# Patient Record
Sex: Male | Born: 1995 | Race: White | Hispanic: No | Marital: Married | Smoking: Never smoker
Health system: Southern US, Community
[De-identification: ages and names within clinical notes are randomized; demographics above are authoritative.]

---

## 2020-10-02 ENCOUNTER — Other Ambulatory Visit: Payer: Self-pay

## 2020-10-02 ENCOUNTER — Emergency Department (HOSPITAL_BASED_OUTPATIENT_CLINIC_OR_DEPARTMENT_OTHER): Payer: Managed Care, Other (non HMO)

## 2020-10-02 ENCOUNTER — Encounter (HOSPITAL_BASED_OUTPATIENT_CLINIC_OR_DEPARTMENT_OTHER): Payer: Self-pay | Admitting: Emergency Medicine

## 2020-10-02 ENCOUNTER — Emergency Department (HOSPITAL_BASED_OUTPATIENT_CLINIC_OR_DEPARTMENT_OTHER)
Admission: EM | Admit: 2020-10-02 | Discharge: 2020-10-02 | Disposition: A | Discharge status: Home / Self Care | Payer: Managed Care, Other (non HMO) | Attending: Emergency Medicine | Admitting: Emergency Medicine

## 2020-10-02 DIAGNOSIS — R0789 Other chest pain: Secondary | ICD-10-CM

## 2020-10-02 MED ORDER — OXYCODONE HCL 5 MG PO TABS
5.0000 mg | ORAL_TABLET | Freq: Once | ORAL | Status: AC
  Administered 2020-10-02: 5 mg via ORAL
  Filled 2020-10-02: qty 1

## 2020-10-02 MED ORDER — KETOROLAC TROMETHAMINE 15 MG/ML IJ SOLN
15.0000 mg | Freq: Once | INTRAMUSCULAR | Status: DC
  Filled 2020-10-02: qty 1

## 2020-10-02 MED ORDER — DIAZEPAM 5 MG PO TABS
5.0000 mg | ORAL_TABLET | Freq: Once | ORAL | Status: AC
  Administered 2020-10-02: 5 mg via ORAL
  Filled 2020-10-02: qty 1

## 2020-10-02 MED ORDER — KETOROLAC TROMETHAMINE 15 MG/ML IJ SOLN
15.0000 mg | Freq: Once | INTRAMUSCULAR | Status: AC
  Administered 2020-10-02: 15 mg via INTRAMUSCULAR

## 2020-10-02 MED ORDER — ACETAMINOPHEN 500 MG PO TABS
1000.0000 mg | ORAL_TABLET | Freq: Once | ORAL | Status: AC
  Administered 2020-10-02: 1000 mg via ORAL
  Filled 2020-10-02: qty 2

## 2020-10-02 NOTE — ED Provider Notes (Signed)
[facility] EMERGENCY DEPARTMENT Provider Note   CSN: [id number] Arrival date & time: 10/02/20  1933     History Chief Complaint  Patient presents with   Chest Pain    Henry Mahoney is a 25 y.o. male.  25 yo M with a chief complaints of left-sided chest pain.  This been going on for some time.  Has been seeing a chiropractor and is seen an orthopedic doctor and then is decided to go see a different orthopedic doctor for a second opinion.  Is awaiting MRI to be evaluated for cervical radiculopathy.  He feels like his pain mostly is on the anterior aspect of the chest and radiates up into the left side of the anterior neck.  Worse with palpation on a certain portion of his chest wall.  Worse with movement twisting deep breathing.  The patient actually had seen their new provider today and was told if the pain worsens to come to the ED.  The history is provided by the patient.  Chest Pain Pain location:  L lateral chest Pain quality: aching and sharp   Pain radiates to:  Does not radiate Pain severity:  Moderate Onset quality:  Gradual Duration:  2 months Timing:  Intermittent Progression:  Waxing and waning Chronicity:  New Relieved by:  Nothing Worsened by:  Nothing Ineffective treatments:  None tried Associated symptoms: no abdominal pain, no fever, no headache, no palpitations, no shortness of breath and no vomiting       History reviewed. No pertinent past medical history.  There are no problems to display for this patient.   History reviewed. No pertinent surgical history.     History reviewed. No pertinent family history.     Home Medications Prior to Admission medications   Not on File    Allergies    Patient has no known allergies.  Review of Systems   Review of Systems  Constitutional:  Negative for chills and fever.  HENT:  Negative for congestion and facial swelling.   Eyes:  Negative for discharge and visual disturbance.   Respiratory:  Negative for shortness of breath.   Cardiovascular:  Positive for chest pain. Negative for palpitations.  Gastrointestinal:  Negative for abdominal pain, diarrhea and vomiting.  Musculoskeletal:  Positive for neck pain. Negative for arthralgias and myalgias.  Skin:  Negative for color change and rash.  Neurological:  Negative for tremors, syncope and headaches.  Psychiatric/Behavioral:  Negative for confusion and dysphoric mood.    Physical Exam Updated Vital Signs BP 134/88   Pulse (!) 57   Temp 98.1 F (36.7 C) (Oral)   Resp 16   Ht 5\' 8"  (1.727 m)   Wt 68 kg   SpO2 100%   BMI 22.81 kg/m   Physical Exam Vitals and nursing note reviewed.  Constitutional:      Appearance: He is well-developed.  HENT:     Head: Normocephalic and atraumatic.  Eyes:     Pupils: Pupils are equal, round, and reactive to light.  Neck:     Vascular: No JVD.  Cardiovascular:     Rate and Rhythm: Normal rate and regular rhythm.     Heart sounds: No murmur heard.   No friction rub. No gallop.  Pulmonary:     Effort: No respiratory distress.     Breath sounds: No wheezing.  Chest:     Chest wall: Tenderness present.     Comments: TTP about the lateral clavicular line about ribs 6-8 reproduces  pain Abdominal:     General: There is no distension.     Tenderness: There is no abdominal tenderness. There is no guarding or rebound.  Musculoskeletal:        General: Normal range of motion.     Cervical back: Normal range of motion and neck supple.     Comments: Full range of motion of the left shoulder without any significant tenderness.  Pulse motor and sensation intact distally.  Negative Spurling's test bilaterally.  Skin:    Coloration: Skin is not pale.     Findings: No rash.  Neurological:     Mental Status: He is alert and oriented to person, place, and time.  Psychiatric:        Behavior: Behavior normal.    ED Results / Procedures / Treatments   Labs (all labs ordered  are listed, but only abnormal results are displayed) Labs Reviewed - No data to display  EKG EKG Interpretation  Date/Time:  Monday October 02 2020 19:46:54 EDT Ventricular Rate:  65 PR Interval:  154 QRS Duration: 84 QT Interval:  366 QTC Calculation: 380 R Axis:   88 Text Interpretation: Normal sinus rhythm with sinus arrhythmia Normal ECG No old tracing to compare Confirmed by Melene Plan [phone]) on 10/02/2020 10:19:25 PM  Radiology DG Chest 2 View  Result Date: 10/02/2020 CLINICAL DATA:  LEFT CHEST PAIN EXAM: CHEST - 2 VIEW COMPARISON:  None. FINDINGS: No consolidation, features of edema, pneumothorax, or effusion. Pulmonary vascularity is normally distributed. The cardiomediastinal contours are unremarkable. No acute osseous or soft tissue abnormality. IMPRESSION: No acute cardiopulmonary abnormality. Electronically Signed   By: Kreg Shropshire M.D.   On: 10/02/2020 20:59    Procedures Procedures   Medications Ordered in ED Medications  acetaminophen (TYLENOL) tablet 1,000 mg (1,000 mg Oral Given 10/02/20 2247)  oxyCODONE (Oxy IR/ROXICODONE) immediate release tablet 5 mg (5 mg Oral Given 10/02/20 2247)  diazepam (VALIUM) tablet 5 mg (5 mg Oral Given 10/02/20 2247)  ketorolac (TORADOL) 15 MG/ML injection 15 mg (15 mg Intramuscular Given 10/02/20 2252)    ED Course  I have reviewed the triage vital signs and the nursing notes.  Pertinent labs & imaging results that were available during my care of the patient were reviewed by me and considered in my medical decision making (see chart for details).    MDM Rules/Calculators/A&P                          25 yo M with many months of left-sided pain.  Has been seeing a chiropractor and an orthopedic doctor and recently changed orthopedic doctors today.  Had a visit today they told him if he had worsening pain to come to the ED.  He told me that it got a little bit worse and so decided to come here.  He does not appear to be any significant  distress.  He has palpable tenderness to the left anterior chest.  Seems to reproduce his symptoms.  I will treat his pain here.  Have him follow-up with his orthopedic doctor.  I feel this is completely atypical of ACS or PE.  He had EKG is unremarkable chest x-ray viewed by me without focal infiltrate pneumothorax.  10:55 PM:  I have discussed the diagnosis/risks/treatment options with the patient and believe the pt to be eligible for discharge home to follow-up with PCP, ortho. We also discussed returning to the ED immediately if new or worsening  sx occur. We discussed the sx which are most concerning (e.g., sudden worsening pain, fever, inability to tolerate by mouth) that necessitate immediate return. Medications administered to the patient during their visit and any new prescriptions provided to the patient are listed below.  Medications given during this visit Medications  acetaminophen (TYLENOL) tablet 1,000 mg (1,000 mg Oral Given 10/02/20 2247)  oxyCODONE (Oxy IR/ROXICODONE) immediate release tablet 5 mg (5 mg Oral Given 10/02/20 2247)  diazepam (VALIUM) tablet 5 mg (5 mg Oral Given 10/02/20 2247)  ketorolac (TORADOL) 15 MG/ML injection 15 mg (15 mg Intramuscular Given 10/02/20 2252)     The patient appears reasonably screen and/or stabilized for discharge and I doubt any other medical condition or other [facility] requiring further screening, evaluation, or treatment in the ED at this time prior to discharge.   Final Clinical Impression(s) / ED Diagnoses Final diagnoses:  Chest wall pain    Rx / DC Orders ED Discharge Orders     None        Melene Plan, DO 10/02/20 2255

## 2020-10-02 NOTE — ED Notes (Signed)
Pt provided discharge instructions and prescription information. Pt was given the opportunity to ask questions and questions were answered. Discharge signature not obtained in the setting of the COVID-19 pandemic in order to reduce high touch surfaces.  ° °Pt teaching provided on medications that may cause drowsiness. Pt instructed not to drive or operate heavy machinery while taking the prescribed medication. Pt verbalized understanding.  ° °

## 2020-10-02 NOTE — ED Triage Notes (Signed)
Pt presents to ED POV. Pt c/o L CP. Pt reports that he was seen at ED sent to ortho for follow up. Is unable to be seen until July and told to come back here if CP worsens

## 2020-10-02 NOTE — Discharge Instructions (Addendum)
Take 4 over the counter ibuprofen tablets 3 times a day or 2 over-the-counter naproxen tablets twice a day for pain. Also take tylenol 1000mg(2 extra strength) four times a day.    

## 2020-11-05 ENCOUNTER — Encounter (HOSPITAL_BASED_OUTPATIENT_CLINIC_OR_DEPARTMENT_OTHER): Payer: Self-pay | Admitting: Emergency Medicine

## 2020-11-05 ENCOUNTER — Emergency Department (HOSPITAL_BASED_OUTPATIENT_CLINIC_OR_DEPARTMENT_OTHER)
Admission: EM | Admit: 2020-11-05 | Discharge: 2020-11-06 | Disposition: A | Discharge status: Home / Self Care | Payer: Managed Care, Other (non HMO) | Attending: Emergency Medicine | Admitting: Emergency Medicine

## 2020-11-05 ENCOUNTER — Other Ambulatory Visit: Payer: Self-pay

## 2020-11-05 ENCOUNTER — Emergency Department (HOSPITAL_BASED_OUTPATIENT_CLINIC_OR_DEPARTMENT_OTHER): Payer: Managed Care, Other (non HMO)

## 2020-11-05 DIAGNOSIS — R072 Precordial pain: Secondary | ICD-10-CM | POA: Diagnosis not present

## 2020-11-05 DIAGNOSIS — M25512 Pain in left shoulder: Secondary | ICD-10-CM | POA: Insufficient documentation

## 2020-11-05 DIAGNOSIS — M25511 Pain in right shoulder: Secondary | ICD-10-CM | POA: Diagnosis not present

## 2020-11-05 DIAGNOSIS — F606 Avoidant personality disorder: Secondary | ICD-10-CM | POA: Insufficient documentation

## 2020-11-05 LAB — BASIC METABOLIC PANEL
Anion gap: 9 (ref 5–15)
BUN: 18 mg/dL (ref 6–20)
CO2: 24 mmol/L (ref 22–32)
Calcium: 9.2 mg/dL (ref 8.9–10.3)
Chloride: 103 mmol/L (ref 98–111)
Creatinine, Ser: 0.98 mg/dL (ref 0.61–1.24)
GFR, Estimated: >60 mL/min (ref >60)
Glucose, Bld: 112 mg/dL — ABNORMAL HIGH (ref 70–99)
Potassium: 3.7 mmol/L (ref 3.5–5.1)
Sodium: 136 mmol/L (ref 135–145)

## 2020-11-05 LAB — CBC
HCT: 40 % (ref 39.0–52.0)
Hemoglobin: 13.9 g/dL (ref 13.0–17.0)
MCH: 31.9 pg (ref 26.0–34.0)
MCHC: 34.8 g/dL (ref 30.0–36.0)
MCV: 91.7 fL (ref 80.0–100.0)
Platelets: 289 10*3/uL (ref 150–400)
RBC: 4.36 MIL/uL (ref 4.22–5.81)
RDW: 12.1 % (ref 11.5–15.5)
WBC: 6.4 10*3/uL (ref 4.0–10.5)
nRBC: 0 % (ref 0.0–0.2)

## 2020-11-05 LAB — TROPONIN I (HIGH SENSITIVITY): Troponin I (High Sensitivity): <2 ng/L (ref <18)

## 2020-11-05 MED ORDER — ALUM & MAG HYDROXIDE-SIMETH 200-200-20 MG/5ML PO SUSP
30.0000 mL | Freq: Once | ORAL | Status: AC
  Administered 2020-11-05: 30 mL via ORAL
  Filled 2020-11-05: qty 30

## 2020-11-05 NOTE — ED Triage Notes (Signed)
Pt reports getting "steroid injections" last Monday in "chest, neck, shoulder"; reports all day "feels like something is sitting on my chest"; reports SHOB and epigastric pain

## 2020-11-05 NOTE — ED Notes (Signed)
EDP at bedside  

## 2020-11-06 ENCOUNTER — Emergency Department (HOSPITAL_BASED_OUTPATIENT_CLINIC_OR_DEPARTMENT_OTHER): Payer: Managed Care, Other (non HMO)

## 2020-11-06 ENCOUNTER — Encounter (HOSPITAL_BASED_OUTPATIENT_CLINIC_OR_DEPARTMENT_OTHER): Payer: Self-pay | Admitting: Emergency Medicine

## 2020-11-06 MED ORDER — IOHEXOL 350 MG/ML SOLN
100.0000 mL | Freq: Once | INTRAVENOUS | Status: AC | PRN
  Administered 2020-11-06: 100 mL via INTRAVENOUS

## 2020-11-06 MED ORDER — KETOROLAC TROMETHAMINE 30 MG/ML IJ SOLN
30.0000 mg | Freq: Once | INTRAMUSCULAR | Status: AC
  Administered 2020-11-06: 30 mg via INTRAVENOUS
  Filled 2020-11-06: qty 1

## 2020-11-06 MED ORDER — OMEPRAZOLE 20 MG PO CPDR: 20.0000 mg | DELAYED_RELEASE_CAPSULE | Freq: Every day | ORAL | 0 refills | Status: AC

## 2020-11-06 NOTE — ED Provider Notes (Signed)
[facility] EMERGENCY DEPARTMENT Provider Note   CSN: [id number] Arrival date & time: 11/05/20  2147     History Chief Complaint  Patient presents with   Chest Pain    Henry FrameJustin Mahoney is a 25 y.o. male.  The history is provided by the patient.  Chest Pain Pain location:  Substernal area Pain quality: pressure   Pain radiates to:  L shoulder and R shoulder Pain severity:  Moderate Onset quality:  Gradual Duration:  2 weeks Timing:  Constant Progression:  Unchanged Chronicity:  New Context comment:  Since getting spinal injections Relieved by:  Nothing Worsened by:  Nothing Ineffective treatments:  None tried Associated symptoms: no abdominal pain, no AICD problem, no altered mental status, no anorexia, no anxiety, no back pain, no claudication, no cough, no diaphoresis, no heartburn, no lower extremity edema, no numbness, no orthopnea, no palpitations, no PND and no vomiting   Risk factors: no aortic disease and no prior DVT/PE      Told to come in by pain management for ongoing CP, after injections.  He got the injections for the pain and it is worse after them.  No weakness, no numbness. No f/c/r.  History reviewed. No pertinent past medical history.  There are no problems to display for this patient.   History reviewed. No pertinent surgical history.     History reviewed. No pertinent family history.  Social History   Tobacco Use   Smoking status: Never   Smokeless tobacco: Never  Substance Use Topics   Alcohol use: Yes   Drug use: Yes    Types: Marijuana    Home Medications Prior to Admission medications   Not on File    Allergies    Patient has no known allergies.  Review of Systems   Review of Systems  Constitutional:  Negative for diaphoresis.  HENT:  Negative for facial swelling.   Eyes:  Negative for redness.  Respiratory:  Negative for cough.   Cardiovascular:  Positive for chest pain. Negative for palpitations, orthopnea,  claudication, leg swelling and PND.  Gastrointestinal:  Negative for abdominal pain, anorexia, heartburn and vomiting.  Genitourinary:  Negative for difficulty urinating.  Musculoskeletal:  Negative for back pain.  Skin:  Negative for rash.  Neurological:  Negative for facial asymmetry and numbness.  All other systems reviewed and are negative.  Physical Exam Updated Vital Signs BP 131/75   Pulse 68   Temp 98.3 F (36.8 C) (Oral)   Resp 17   Ht 5\' 7"  (1.702 m)   Wt 63.5 kg   SpO2 98%   BMI 21.93 kg/m   Physical Exam Vitals and nursing note reviewed.  Constitutional:      General: He is not in acute distress.    Appearance: Normal appearance. He is not diaphoretic.  HENT:     Head: Normocephalic and atraumatic.     Nose: Nose normal.  Eyes:     Conjunctiva/sclera: Conjunctivae normal.     Pupils: Pupils are equal, round, and reactive to light.  Cardiovascular:     Rate and Rhythm: Normal rate and regular rhythm.     Pulses: Normal pulses.     Heart sounds: Normal heart sounds.  Pulmonary:     Effort: Pulmonary effort is normal.     Breath sounds: Normal breath sounds.  Abdominal:     General: Abdomen is flat. Bowel sounds are normal.     Palpations: Abdomen is soft.     Tenderness: There is  no abdominal tenderness.  Musculoskeletal:        General: Normal range of motion.     Cervical back: Normal range of motion and neck supple.  Skin:    General: Skin is warm and dry.     Capillary Refill: Capillary refill takes less than 2 seconds.  Neurological:     General: No focal deficit present.     Mental Status: He is alert and oriented to person, place, and time.     Deep Tendon Reflexes: Reflexes normal.  Psychiatric:        Mood and Affect: Mood is anxious.        Thought Content: Thought content normal.    ED Results / Procedures / Treatments   Labs (all labs ordered are listed, but only abnormal results are displayed) Results for orders placed or performed  during the hospital encounter of 11/05/20  Basic metabolic panel  Result Value Ref Range   Sodium 136 135 - 145 mmol/L   Potassium 3.7 3.5 - 5.1 mmol/L   Chloride 103 98 - 111 mmol/L   CO2 24 22 - 32 mmol/L   Glucose, Bld 112 (H) 70 - 99 mg/dL   BUN 18 6 - 20 mg/dL   Creatinine, Ser 6.60 0.61 - 1.24 mg/dL   Calcium 9.2 8.9 - 63.0 mg/dL   GFR, Estimated >16 >01 mL/min   Anion gap 9 5 - 15  CBC  Result Value Ref Range   WBC 6.4 4.0 - 10.5 K/uL   RBC 4.36 4.22 - 5.81 MIL/uL   Hemoglobin 13.9 13.0 - 17.0 g/dL   HCT 09.3 23.5 - 57.3 %   MCV 91.7 80.0 - 100.0 fL   MCH 31.9 26.0 - 34.0 pg   MCHC 34.8 30.0 - 36.0 g/dL   RDW 22.0 25.4 - 27.0 %   Platelets 289 150 - 400 K/uL   nRBC 0.0 0.0 - 0.2 %  Troponin I (High Sensitivity)  Result Value Ref Range   Troponin I (High Sensitivity) <2 <18 ng/L   DG Chest 2 View  Result Date: 11/05/2020 CLINICAL DATA:  Chest pain and shortness of breath. EXAM: CHEST - 2 VIEW COMPARISON:  10/02/2020 FINDINGS: The heart size and mediastinal contours are within normal limits. Both lungs are clear. The visualized skeletal structures are unremarkable. IMPRESSION: Normal exam. Electronically Signed   By: Danae Orleans M.D.   On: 11/05/2020 22:36   CT Angio Chest PE W and/or Wo Contrast  Result Date: 11/06/2020 CLINICAL DATA:  Chest pain or SOB, pleurisy or effusion suspected. Pt reports getting "steroid injections" last Monday in "chest, neck, shoulder"; reports all day "feels like something is sitting on my chest"; reports SHOB and epigastric pain EXAM: CT ANGIOGRAPHY CHEST WITH CONTRAST TECHNIQUE: Multidetector CT imaging of the chest was performed using the standard protocol during bolus administration of intravenous contrast. Multiplanar CT image reconstructions and MIPs were obtained to evaluate the vascular anatomy. CONTRAST:  OMNIPAQUE IOHEXOL 350 MG/ML SOLN COMPARISON:  None. FINDINGS: Cardiovascular: Satisfactory opacification of the pulmonary  arteries to the segmental level. No evidence of pulmonary embolism. Normal heart size. No pericardial effusion. Mediastinum/Nodes: No enlarged mediastinal, hilar, or axillary lymph nodes. Thyroid gland, trachea, and esophagus demonstrate no significant findings. Lungs/Pleura: Lungs are clear. No pleural effusion or pneumothorax. Upper Abdomen: No acute abnormality. Musculoskeletal: No chest wall abnormality. No acute or significant osseous findings. Review of the MIP images confirms the above findings. IMPRESSION: No pulmonary embolism.  No acute intrathoracic  pathology identified. Electronically Signed   By: Helyn Numbers MD   On: 11/06/2020 00:51     EKG EKG Interpretation  Date/Time:  Sunday November 05 2020 21:58:15 EDT Ventricular Rate:  76 PR Interval:  150 QRS Duration: 86 QT Interval:  376 QTC Calculation: 423 R Axis:   93 Text Interpretation: Normal sinus rhythm with sinus arrhythmia Confirmed by Deniah Saia ([id number]) on 11/05/2020 11:13:04 PM  Radiology DG Chest 2 View  Result Date: 11/05/2020 CLINICAL DATA:  Chest pain and shortness of breath. EXAM: CHEST - 2 VIEW COMPARISON:  10/02/2020 FINDINGS: The heart size and mediastinal contours are within normal limits. Both lungs are clear. The visualized skeletal structures are unremarkable. IMPRESSION: Normal exam. Electronically Signed   By: Danae Orleans M.D.   On: 11/05/2020 22:36   CT Angio Chest PE W and/or Wo Contrast  Result Date: 11/06/2020 CLINICAL DATA:  Chest pain or SOB, pleurisy or effusion suspected. Pt reports getting "steroid injections" last Monday in "chest, neck, shoulder"; reports all day "feels like something is sitting on my chest"; reports SHOB and epigastric pain EXAM: CT ANGIOGRAPHY CHEST WITH CONTRAST TECHNIQUE: Multidetector CT imaging of the chest was performed using the standard protocol during bolus administration of intravenous contrast. Multiplanar CT image reconstructions and MIPs were obtained to evaluate the  vascular anatomy. CONTRAST:  OMNIPAQUE IOHEXOL 350 MG/ML SOLN COMPARISON:  None. FINDINGS: Cardiovascular: Satisfactory opacification of the pulmonary arteries to the segmental level. No evidence of pulmonary embolism. Normal heart size. No pericardial effusion. Mediastinum/Nodes: No enlarged mediastinal, hilar, or axillary lymph nodes. Thyroid gland, trachea, and esophagus demonstrate no significant findings. Lungs/Pleura: Lungs are clear. No pleural effusion or pneumothorax. Upper Abdomen: No acute abnormality. Musculoskeletal: No chest wall abnormality. No acute or significant osseous findings. Review of the MIP images confirms the above findings. IMPRESSION: No pulmonary embolism.  No acute intrathoracic pathology identified. Electronically Signed   By: Helyn Numbers MD   On: 11/06/2020 00:51    Procedures Procedures   Medications Ordered in ED Medications  ketorolac (TORADOL) 30 MG/ML injection 30 mg (has no administration in time range)  alum & mag hydroxide-simeth (MAALOX/MYLANTA) 200-200-20 MG/5ML suspension 30 mL (30 mLs Oral Given 11/05/20 2344)  iohexol (OMNIPAQUE) 350 MG/ML injection 100 mL (100 mLs Intravenous Contrast Given 11/06/20 0026)    ED Course  I have reviewed the triage vital signs and the nursing notes.  Pertinent labs & imaging results that were available during my care of the patient were reviewed by me and considered in my medical decision making (see chart for details).    MDM Rules/Calculators/A&P                           I suspect this is GERD with Superimposed anxiety.  Will start PPI and ref back to PMD.  Based on time course patient has ruled out for MI with negative ekg and troponin.  He has also ruled out for PE.    Fue Cervenka was evaluated in Emergency Department on 11/06/2020 for the symptoms described in the history of present illness. He was evaluated in the context of the global COVID-19 pandemic, which necessitated consideration that the  patient might be at risk for infection with the SARS-CoV-2 virus that causes COVID-19. Institutional protocols and algorithms that pertain to the evaluation of patients at risk for COVID-19 are in a state of rapid change based on information released by regulatory bodies including the CDC  and federal and state organizations. These policies and algorithms were followed during the patient's care in the ED.  Final Clinical Impression(s) / ED Diagnoses Final diagnoses:  None   Return for intractable cough, coughing up blood, fevers > 100.4 unrelieved by medication, shortness of breath, intractable vomiting, chest pain, shortness of breath, weakness, numbness, changes in speech, facial asymmetry, abdominal pain, passing out, Inability to tolerate liquids or food, cough, altered mental status or any concerns. No signs of systemic illness or infection. The patient is nontoxic-appearing on exam and vital signs are within normal limits. I have reviewed the triage vital signs and the nursing notes. Pertinent labs & imaging results that were available during my care of the patient were reviewed by me and considered in my medical decision making (see chart for details). After history, exam, and medical workup I feel the patient has been appropriately medically screened and is safe for discharge home. Pertinent diagnoses were discussed with the patient. Patient was given return precautions. Rx / DC Orders ED Discharge Orders     None        Kelvyn Schunk, MD 11/06/20 [id number]

## 2022-06-09 IMAGING — DX DG CHEST 2V
2 series · 2 of 2 positions shown · non-contrast
Comparison: None.

CLINICAL DATA: LEFT CHEST PAIN

EXAM:
CHEST - 2 VIEW

[chest pa]
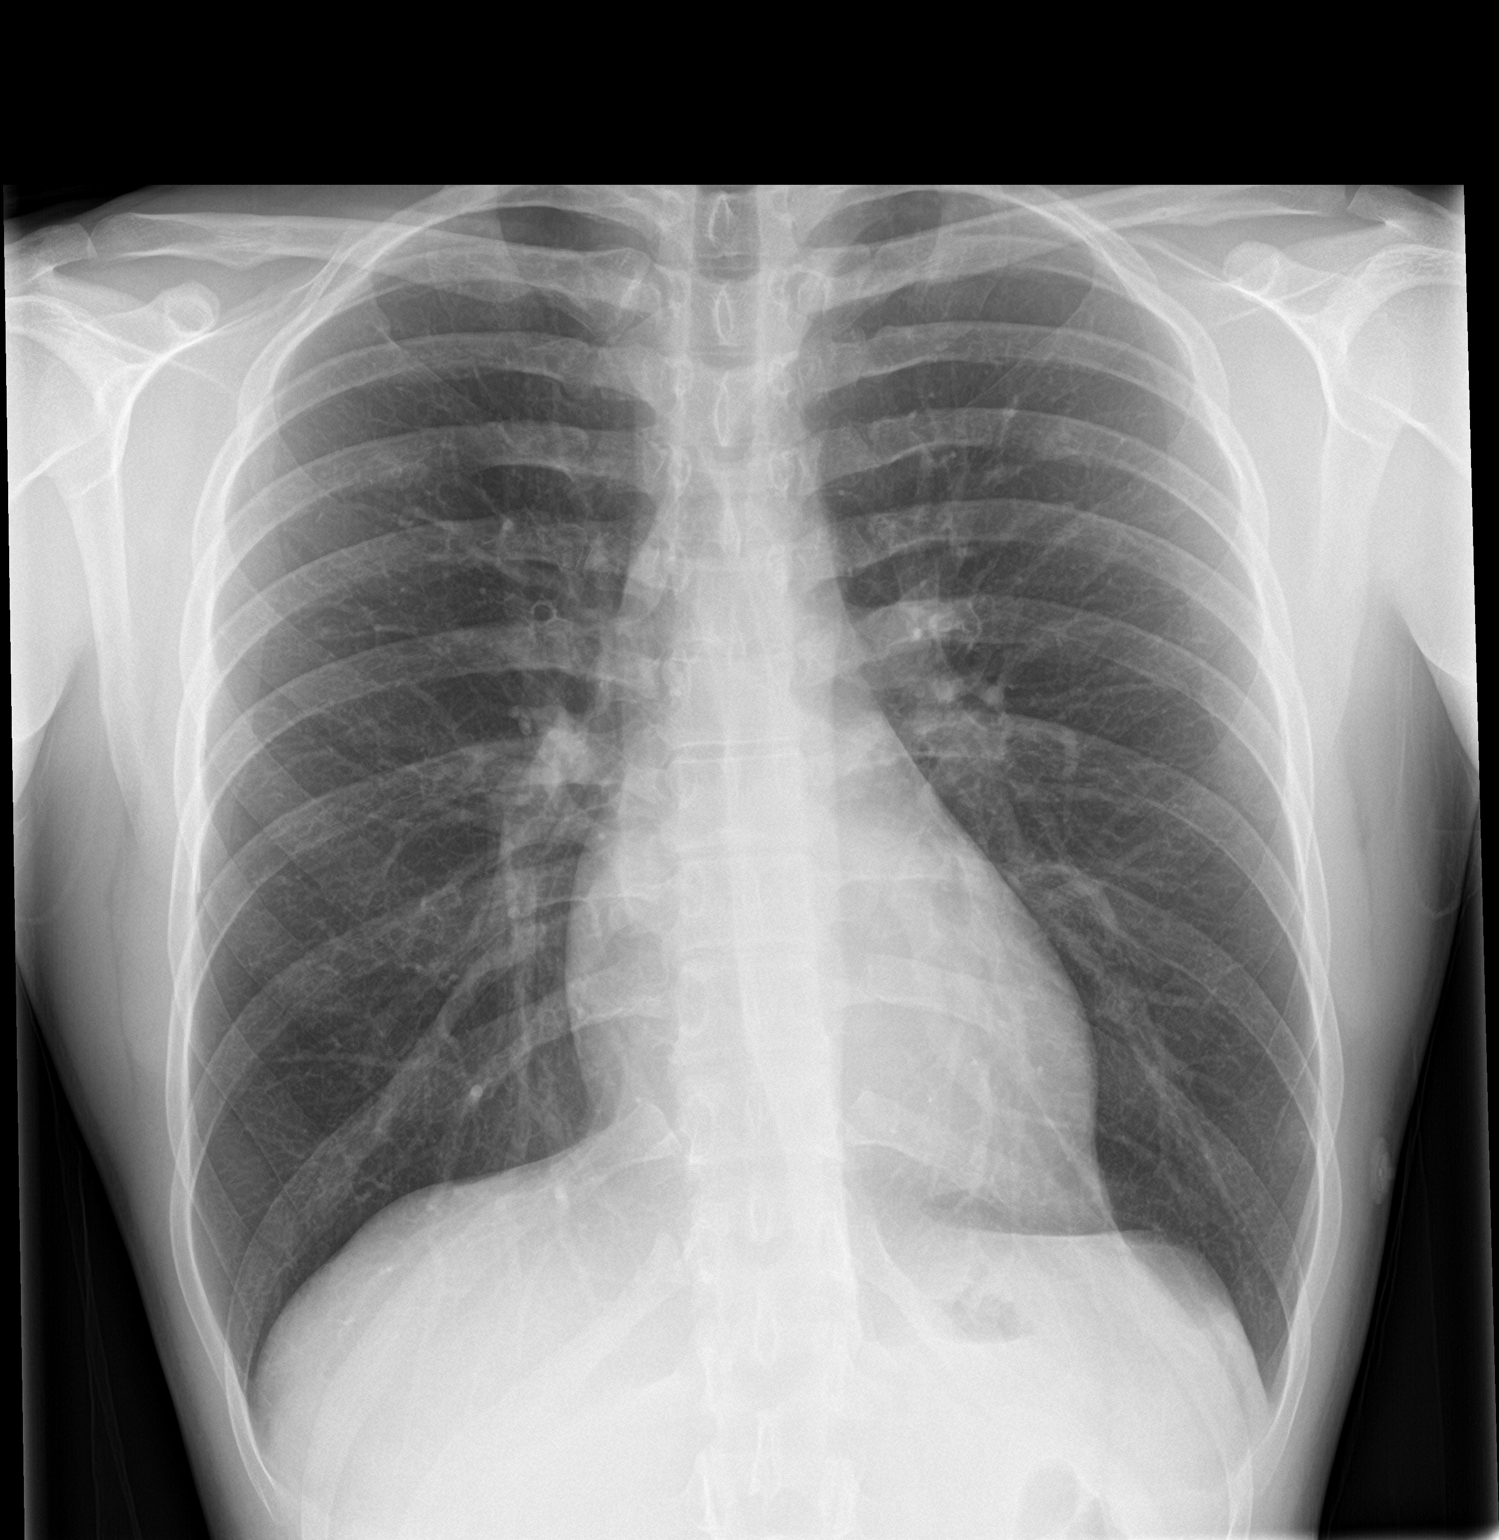

[chest lat]
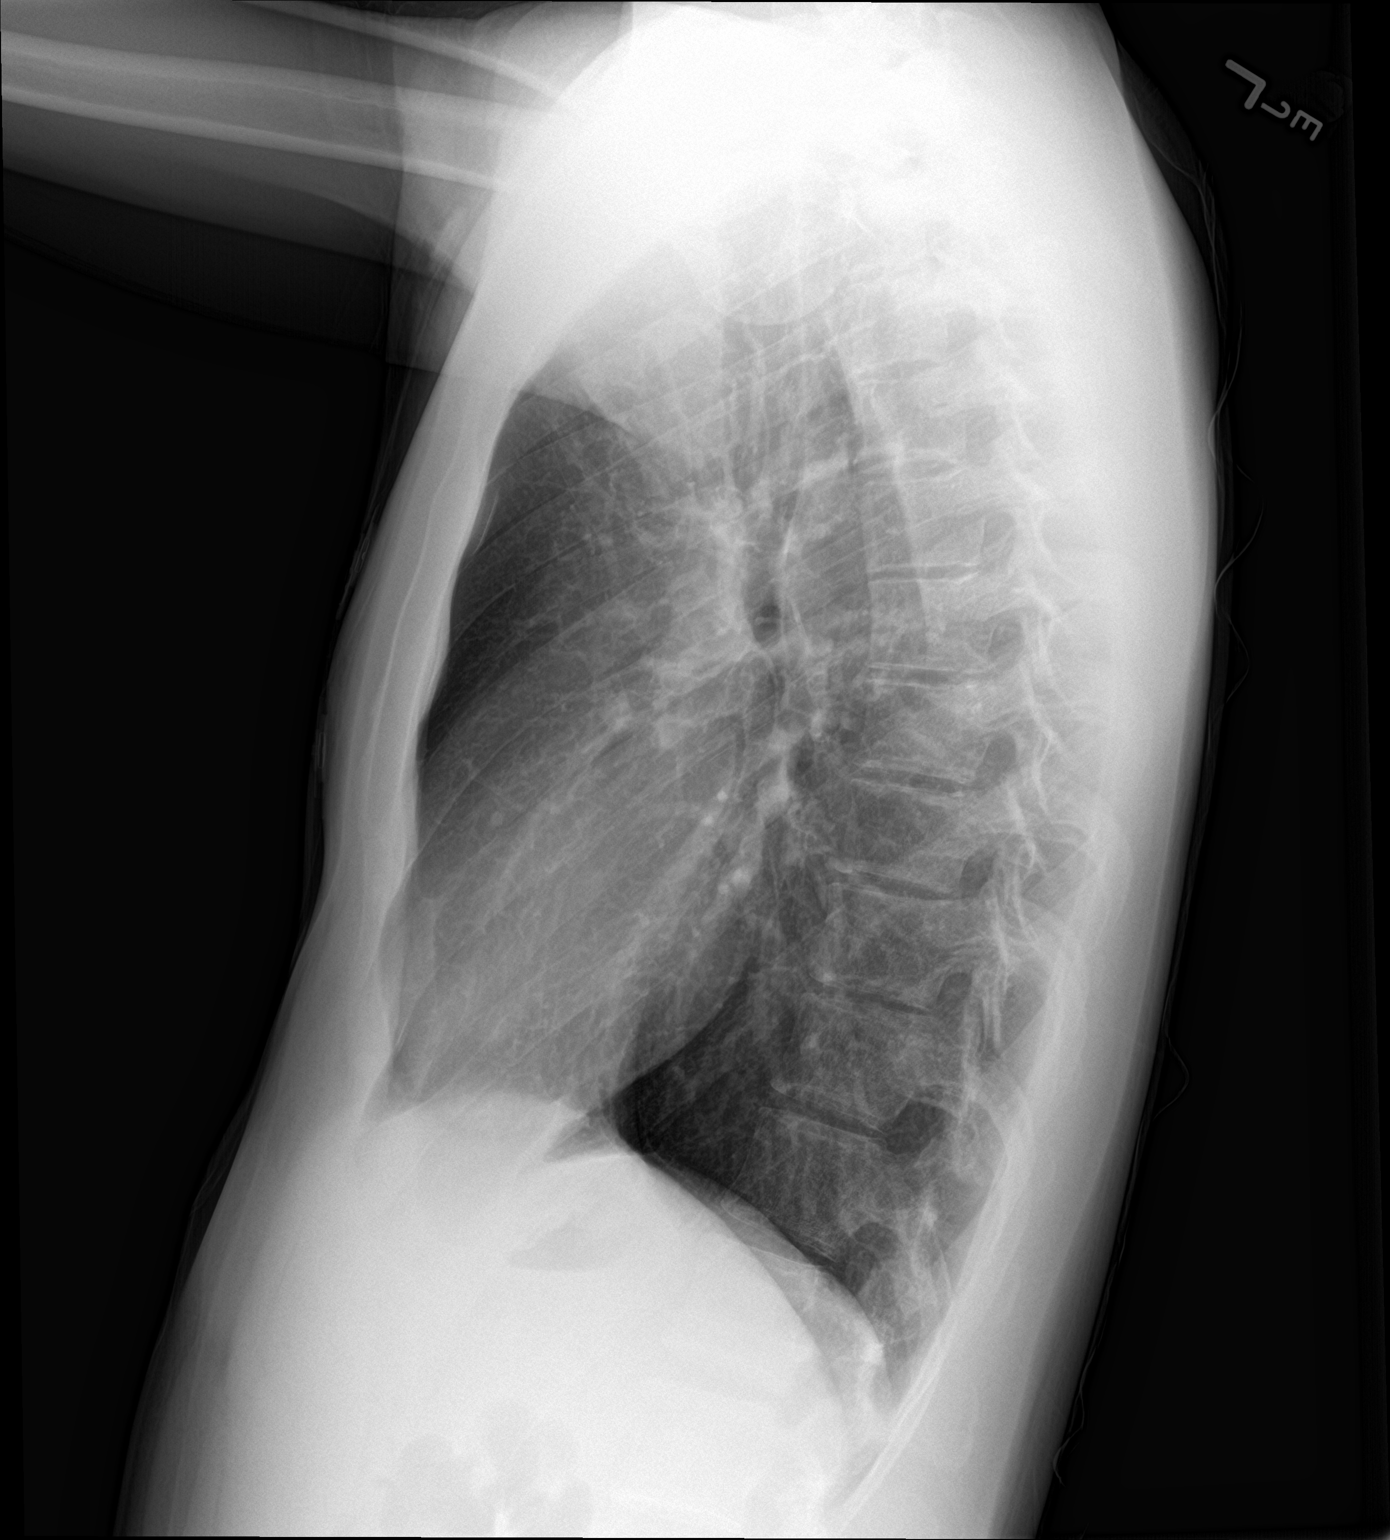

[2 of 2 positions shown; findings below may reference images not displayed]

FINDINGS: No consolidation, features of edema, pneumothorax, or effusion.
Pulmonary vascularity is normally distributed. The cardiomediastinal
contours are unremarkable. No acute osseous or soft tissue
abnormality.
IMPRESSION: No acute cardiopulmonary abnormality.
